# Patient Record
Sex: Female | Born: 1955 | Hispanic: No | Marital: Married | Smoking: Never smoker
Health system: Southern US, Community
[De-identification: ages and names within clinical notes are randomized; demographics above are authoritative.]

## PROBLEM LIST (undated history)

## (undated) DIAGNOSIS — M549 Dorsalgia, unspecified: Secondary | ICD-10-CM

## (undated) HISTORY — PX: TUBAL LIGATION: SHX77

---

## 1999-07-04 ENCOUNTER — Encounter: Admission: RE | Admit: 1999-07-04 | Discharge: 1999-07-04 | Payer: Self-pay | Admitting: Gynecology

## 2001-10-20 ENCOUNTER — Other Ambulatory Visit: Admission: RE | Admit: 2001-10-20 | Discharge: 2001-10-20 | Payer: Self-pay | Admitting: Gynecology

## 2003-01-17 ENCOUNTER — Encounter: Payer: Self-pay | Admitting: Gynecology

## 2003-01-17 ENCOUNTER — Encounter: Admission: RE | Admit: 2003-01-17 | Discharge: 2003-01-17 | Payer: Self-pay | Admitting: Gynecology

## 2005-04-11 ENCOUNTER — Other Ambulatory Visit: Admission: RE | Admit: 2005-04-11 | Discharge: 2005-04-11 | Payer: Self-pay | Admitting: Gynecology

## 2005-05-06 ENCOUNTER — Encounter: Admission: RE | Admit: 2005-05-06 | Discharge: 2005-05-06 | Payer: Self-pay | Admitting: Gynecology

## 2007-04-28 ENCOUNTER — Other Ambulatory Visit: Admission: RE | Admit: 2007-04-28 | Discharge: 2007-04-28 | Payer: Self-pay | Admitting: Gynecology

## 2007-05-04 ENCOUNTER — Encounter: Admission: RE | Admit: 2007-05-04 | Discharge: 2007-05-04 | Payer: Self-pay | Admitting: Gynecology

## 2007-05-19 ENCOUNTER — Encounter: Admission: RE | Admit: 2007-05-19 | Discharge: 2007-05-19 | Payer: Self-pay | Admitting: Gynecology

## 2008-11-30 ENCOUNTER — Encounter: Admission: RE | Admit: 2008-11-30 | Discharge: 2008-11-30 | Payer: Self-pay | Admitting: Obstetrics and Gynecology

## 2009-12-21 ENCOUNTER — Encounter: Admission: RE | Admit: 2009-12-21 | Discharge: 2009-12-21 | Payer: Self-pay | Admitting: Obstetrics and Gynecology

## 2010-10-13 ENCOUNTER — Encounter: Payer: Self-pay | Admitting: Gynecology

## 2011-02-06 ENCOUNTER — Other Ambulatory Visit: Payer: Self-pay | Admitting: Obstetrics and Gynecology

## 2011-02-06 DIAGNOSIS — Z1231 Encounter for screening mammogram for malignant neoplasm of breast: Secondary | ICD-10-CM

## 2011-02-10 ENCOUNTER — Ambulatory Visit
Admission: RE | Admit: 2011-02-10 | Discharge: 2011-02-10 | Disposition: A | Discharge status: Home / Self Care | Payer: 59 | Source: Ambulatory Visit | Attending: Obstetrics and Gynecology | Admitting: Obstetrics and Gynecology

## 2011-02-10 DIAGNOSIS — Z1231 Encounter for screening mammogram for malignant neoplasm of breast: Secondary | ICD-10-CM

## 2012-04-06 ENCOUNTER — Other Ambulatory Visit: Payer: Self-pay | Admitting: Obstetrics and Gynecology

## 2012-04-06 DIAGNOSIS — Z1231 Encounter for screening mammogram for malignant neoplasm of breast: Secondary | ICD-10-CM

## 2012-04-19 ENCOUNTER — Ambulatory Visit
Admission: RE | Admit: 2012-04-19 | Discharge: 2012-04-19 | Disposition: A | Discharge status: Home / Self Care | Payer: BC Managed Care – PPO | Source: Ambulatory Visit | Attending: Obstetrics and Gynecology | Admitting: Obstetrics and Gynecology

## 2012-04-19 DIAGNOSIS — Z1231 Encounter for screening mammogram for malignant neoplasm of breast: Secondary | ICD-10-CM

## 2013-08-29 ENCOUNTER — Other Ambulatory Visit: Payer: Self-pay

## 2013-08-29 DIAGNOSIS — Z1231 Encounter for screening mammogram for malignant neoplasm of breast: Secondary | ICD-10-CM

## 2013-09-27 ENCOUNTER — Ambulatory Visit
Admission: RE | Admit: 2013-09-27 | Discharge: 2013-09-27 | Disposition: A | Discharge status: Home / Self Care | Payer: BC Managed Care – PPO | Source: Ambulatory Visit

## 2013-09-27 DIAGNOSIS — Z1231 Encounter for screening mammogram for malignant neoplasm of breast: Secondary | ICD-10-CM

## 2015-01-26 ENCOUNTER — Other Ambulatory Visit: Payer: Self-pay

## 2015-01-26 DIAGNOSIS — Z1231 Encounter for screening mammogram for malignant neoplasm of breast: Secondary | ICD-10-CM

## 2015-02-26 ENCOUNTER — Ambulatory Visit
Admission: RE | Admit: 2015-02-26 | Discharge: 2015-02-26 | Disposition: A | Discharge status: Home / Self Care | Payer: BLUE CROSS/BLUE SHIELD | Source: Ambulatory Visit

## 2015-02-26 DIAGNOSIS — Z1231 Encounter for screening mammogram for malignant neoplasm of breast: Secondary | ICD-10-CM

## 2015-03-08 ENCOUNTER — Other Ambulatory Visit: Payer: Self-pay | Admitting: Obstetrics and Gynecology

## 2015-03-09 ENCOUNTER — Other Ambulatory Visit: Payer: Self-pay | Admitting: Obstetrics and Gynecology

## 2015-03-09 DIAGNOSIS — E2839 Other primary ovarian failure: Secondary | ICD-10-CM

## 2016-05-02 ENCOUNTER — Other Ambulatory Visit: Payer: Self-pay | Admitting: Obstetrics and Gynecology

## 2016-05-02 DIAGNOSIS — Z1231 Encounter for screening mammogram for malignant neoplasm of breast: Secondary | ICD-10-CM

## 2016-05-12 ENCOUNTER — Ambulatory Visit
Admission: RE | Admit: 2016-05-12 | Discharge: 2016-05-12 | Disposition: A | Discharge status: Home / Self Care | Payer: BLUE CROSS/BLUE SHIELD | Source: Ambulatory Visit | Attending: Obstetrics and Gynecology | Admitting: Obstetrics and Gynecology

## 2016-05-12 DIAGNOSIS — Z1231 Encounter for screening mammogram for malignant neoplasm of breast: Secondary | ICD-10-CM

## 2017-06-29 ENCOUNTER — Other Ambulatory Visit: Payer: Self-pay | Admitting: Orthopedic Surgery

## 2017-06-29 DIAGNOSIS — M545 Low back pain: Secondary | ICD-10-CM

## 2017-07-01 ENCOUNTER — Encounter (HOSPITAL_COMMUNITY): Payer: Self-pay | Admitting: Emergency Medicine

## 2017-07-01 ENCOUNTER — Emergency Department (HOSPITAL_COMMUNITY)
Admission: EM | Admit: 2017-07-01 | Discharge: 2017-07-01 | Disposition: A | Discharge status: Home / Self Care | Payer: BLUE CROSS/BLUE SHIELD | Attending: Emergency Medicine | Admitting: Emergency Medicine

## 2017-07-01 DIAGNOSIS — Z7982 Long term (current) use of aspirin: Secondary | ICD-10-CM | POA: Diagnosis not present

## 2017-07-01 DIAGNOSIS — Z79899 Other long term (current) drug therapy: Secondary | ICD-10-CM | POA: Insufficient documentation

## 2017-07-01 DIAGNOSIS — Z7901 Long term (current) use of anticoagulants: Secondary | ICD-10-CM | POA: Diagnosis not present

## 2017-07-01 DIAGNOSIS — I4891 Unspecified atrial fibrillation: Secondary | ICD-10-CM | POA: Insufficient documentation

## 2017-07-01 DIAGNOSIS — I499 Cardiac arrhythmia, unspecified: Secondary | ICD-10-CM | POA: Diagnosis present

## 2017-07-01 HISTORY — DX: Dorsalgia, unspecified: M54.9

## 2017-07-01 LAB — BASIC METABOLIC PANEL
Anion gap: 11 (ref 5–15)
BUN: 26 mg/dL — AB (ref 6–20)
CHLORIDE: 103 mmol/L (ref 101–111)
CO2: 26 mmol/L (ref 22–32)
Calcium: 9.7 mg/dL (ref 8.9–10.3)
Creatinine, Ser: 0.88 mg/dL (ref 0.44–1.00)
GLUCOSE: 120 mg/dL — AB (ref 65–99)
POTASSIUM: 4 mmol/L (ref 3.5–5.1)
SODIUM: 140 mmol/L (ref 135–145)

## 2017-07-01 LAB — CBC
HCT: 40.1 % (ref 36.0–46.0)
HEMOGLOBIN: 13.8 g/dL (ref 12.0–15.0)
MCH: 31.2 pg (ref 26.0–34.0)
MCHC: 34.4 g/dL (ref 30.0–36.0)
MCV: 90.5 fL (ref 78.0–100.0)
Platelets: 290 10*3/uL (ref 150–400)
RBC: 4.43 MIL/uL (ref 3.87–5.11)
RDW: 12.3 % (ref 11.5–15.5)
WBC: 11.8 10*3/uL — ABNORMAL HIGH (ref 4.0–10.5)

## 2017-07-01 MED ORDER — APIXABAN 5 MG PO TABS: 5.0000 mg | ORAL_TABLET | Freq: Two times a day (BID) | ORAL | 0 refills | Status: AC

## 2017-07-01 MED ORDER — METOPROLOL SUCCINATE ER 25 MG PO TB24: 25.0000 mg | ORAL_TABLET | Freq: Every day | ORAL | 0 refills | Status: AC

## 2017-07-01 MED ORDER — ETOMIDATE 2 MG/ML IV SOLN
INTRAVENOUS | Status: AC | PRN
  Administered 2017-07-01: 14 mg via INTRAVENOUS

## 2017-07-01 MED ORDER — METOPROLOL TARTRATE 5 MG/5ML IV SOLN
5.0000 mg | Freq: Once | INTRAVENOUS | Status: AC
  Administered 2017-07-01: 5 mg via INTRAVENOUS
  Filled 2017-07-01: qty 5

## 2017-07-01 MED ORDER — ETOMIDATE 2 MG/ML IV SOLN
0.2000 mg/kg | Freq: Once | INTRAVENOUS | Status: DC
Start: 2017-07-01 — End: 2017-07-01

## 2017-07-01 NOTE — ED Triage Notes (Signed)
Pt c/o irregular heart rate since last evening. Pt states she had recently started steroid dose pack for possible herniated disc. Pt states she went to work this morning and had EKG done (surgical center) and patient was in a-fib. No hx of a-fib. Denies cp, denies sob.

## 2017-07-01 NOTE — ED Provider Notes (Signed)
[facility]-EMERGENCY DEPT Provider Note   CSN: [id number] Arrival date & time: 07/01/17  0746     History   Chief Complaint Chief Complaint  Patient presents with  . Irregular Heart Beat    HPI Kara Lawson is a 61 y.o. female.  HPI Patient presents emergency department with new-onset palpitations began this morning.Associated chest pain or shortness breath.  No chest tightness.  She was recent started on steroids for left-sided sciatica.  Her sciatica has improved but noticed this this morning.  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  She's never been in A. fib before.  She took aspirin prior to arrival.  Can still feel as though she is in an abnormal rhythm now.  No palpitations over the past several days until early this morning   Past Medical History:  Diagnosis Date  . Back pain     There are no active problems to display for this patient.   Past Surgical History:  Procedure Laterality Date  . TUBAL LIGATION      OB History    No data available       Home Medications    Prior to Admission medications   Medication Sig Start Date End Date Taking? Authorizing Provider  aspirin EC 81 MG tablet Take 162 mg by mouth once.   Yes [provider]  HYDROcodone-acetaminophen (NORCO/VICODIN) 5-325 MG tablet Take 1 tablet by mouth every 4 (four) hours as needed for pain. 06/29/17  Yes [provider]  methocarbamol (ROBAXIN) 750 MG tablet Take 750 mg by mouth every 6 (six) hours as needed for muscle spasms. 06/29/17  Yes [provider]  predniSONE (STERAPRED UNI-PAK 21 TAB) 10 MG (21) TBPK tablet Take 10-60 mg by mouth as directed. TAKE 6 TABLETS ON DAY 1 AS DIRECTED ON PACKAGE AND DECREASE BY 1 TAB EACH DAY FOR A TOTAL OF 6 DAYS Started 10/08 06/29/17  Yes [provider]  apixaban (ELIQUIS) 5 MG TABS tablet Take 1 tablet (5 mg total) by mouth 2 (two) times daily. 07/01/17    Azalia Bilis, MD  metoprolol succinate (TOPROL-XL) 25 MG 24 hr tablet Take 1 tablet (25 mg total) by mouth daily. 07/01/17   Azalia Bilis, MD    Family History Family History  Problem Relation Age of Onset  . Atrial fibrillation Father     Social History Social History  Substance Use Topics  . Smoking status: Never Smoker  . Smokeless tobacco: Never Used  . Alcohol use No     Allergies   Patient has no known allergies.   Review of Systems Review of Systems  All other systems reviewed and are negative.    Physical Exam Updated Vital Signs BP (!) 163/82   Pulse (!) 56   Temp 98.1 F (36.7 C) (Oral)   Resp 15   Wt 68 kg (150 lb)   SpO2 98%   Physical Exam  Constitutional: She is oriented to person, place, and time. She appears well-developed and well-nourished. No distress.  HENT:  Head: Normocephalic and atraumatic.  Eyes: EOM are normal.  Neck: Normal range of motion.  Cardiovascular:  Regular rate.  Irregularly irregular rhythm  Pulmonary/Chest: Effort normal and breath sounds normal.  Abdominal: Soft. She exhibits no distension. There is no tenderness.  Musculoskeletal: Normal range of motion.  Neurological: She is alert and oriented to person, place, and time.  Skin: Skin is warm and  dry.  Psychiatric: She has a normal mood and affect. Judgment normal.  Nursing note and vitals reviewed.    ED Treatments / Results  Labs (all labs ordered are listed, but only abnormal results are displayed) Labs Reviewed  BASIC METABOLIC PANEL - Abnormal; Notable for the following:       Result Value   Glucose, Bld 120 (*)    BUN 26 (*)    All other components within normal limits  CBC - Abnormal; Notable for the following:    WBC 11.8 (*)    All other components within normal limits    EKG  EKG Interpretation #1  Date/Time:  Wednesday July 01 2017 08:02:19 EDT Ventricular Rate:  86 PR Interval:    QRS Duration: 88 QT Interval:  354 QTC  Calculation: 424 R Axis:   -31 Text Interpretation:  Atrial fibrillation Left axis deviation Low voltage, precordial leads RSR' in V1 or V2, probably normal variant No old tracing to compare Confirmed by Azalia Bilis ([id number]) on 07/01/2017 11:43:42 AM        EKG Interpretation #2  Date/Time:  Wednesday July 01 2017 10:37:01 EDT Ventricular Rate:  56 PR Interval:    QRS Duration: 92 QT Interval:  407 QTC Calculation: 393 R Axis:   -34 Text Interpretation:  Sinus rhythm Left axis deviation Low voltage, precordial leads no longer in atrial fibrillation Confirmed by Azalia Bilis ([id number]) on 07/01/2017 11:44:43 AM        Radiology No results found.  Procedures .Sedation Performed by: Azalia Bilis Authorized by: Azalia Bilis   Consent:    Consent obtained:  Verbal   Consent given by:  Patient   Risks discussed:  Allergic reaction, dysrhythmia, inadequate sedation, nausea, prolonged hypoxia resulting in organ damage, prolonged sedation necessitating reversal, respiratory compromise necessitating ventilatory assistance and intubation and vomiting   Alternatives discussed:  Analgesia without sedation Universal protocol:    Procedure explained and questions answered to patient or proxy's satisfaction: yes     Relevant documents present and verified: yes     Test results available and properly labeled: yes     Required blood products, implants, devices, and special equipment available: yes     Immediately prior to procedure a time out was called: yes     Patient identity confirmation method:  Verbally with patient Indications:    Procedure necessitating sedation performed by:  Physician performing sedation   Intended level of sedation:  Deep Pre-sedation assessment:    ASA classification: class 1 - normal, healthy patient     Neck mobility: normal     Mouth opening:  3 or more finger widths   Thyromental distance:  4 finger widths   Mallampati score:  I - soft palate,  uvula, fauces, pillars visible   Pre-sedation assessments completed and reviewed: airway patency, cardiovascular function, hydration status, mental status, nausea/vomiting, pain level, respiratory function and temperature   Immediate pre-procedure details:    Reassessment: Patient reassessed immediately prior to procedure     Reviewed: vital signs, relevant labs/tests and NPO status     Verified: bag valve mask available, emergency equipment available, intubation equipment available, IV patency confirmed, oxygen available and suction available   Procedure details (see MAR for exact dosages):    Preoxygenation:  Nasal cannula   Sedation:  Etomidate   Intra-procedure monitoring:  Blood pressure monitoring, cardiac monitor, continuous pulse oximetry, frequent LOC assessments, frequent vital sign checks and continuous capnometry   Intra-procedure events: none  Total Provider sedation time (minutes):  15 Post-procedure details:    Attendance: Constant attendance by certified staff until patient recovered     Recovery: Patient returned to pre-procedure baseline     Post-sedation assessments completed and reviewed: airway patency, cardiovascular function, hydration status, mental status, nausea/vomiting, pain level, respiratory function and temperature     Patient is stable for discharge or admission: yes     Patient tolerance:  Tolerated well, no immediate complications .Cardioversion Performed by: Azalia Bilis Authorized by: Azalia Bilis   Consent:    Consent obtained:  Verbal   Consent given by:  Patient   Risks discussed:  Cutaneous burn and death   Alternatives discussed:  No treatment Pre-procedure details:    Cardioversion basis:  Elective   Rhythm:  Atrial fibrillation   Electrode placement:  Anterior-posterior Patient sedated: Yes. Refer to sedation procedure documentation for details of sedation.  Attempt one:    Cardioversion mode:  Synchronous   Waveform:  Biphasic    Shock (Joules):  150   Shock outcome:  Conversion to normal sinus rhythm Post-procedure details:    Patient status:  Awake   Patient tolerance of procedure:  Tolerated well, no immediate complications   (including critical care time)  Medications Ordered in ED Medications  etomidate (AMIDATE) injection 13.42 mg (13.42 mg Intravenous See Procedure Record 07/01/17 1055)  metoprolol tartrate (LOPRESSOR) injection 5 mg (5 mg Intravenous Given 07/01/17 0833)  metoprolol tartrate (LOPRESSOR) injection 5 mg (5 mg Intravenous Given 07/01/17 0911)  etomidate (AMIDATE) injection (14 mg Intravenous Given 07/01/17 1035)     Initial Impression / Assessment and Plan / ED Course  I have reviewed the triage vital signs and the nursing notes.  Pertinent labs & imaging results that were available during my care of the patient were reviewed by me and considered in my medical decision making (see chart for details).     New-onset atrial fibrillation.  Patient was unable to convert with IV metoprolol.  DC cardioversion in the emergency department resulted in return to sinus rhythm.  Patient will follow-up in the A. fib clinic.  Patient be discharged home on anticoagulation for 30 days.  Standard anticoagulation warnings given.  Home on metoprolol.  Patient understands to return to the ER for new or worsening symptoms    CHA2DS2/VAS Stroke Risk Points     Score = 1   Final Clinical Impressions(s) / ED Diagnoses   Final diagnoses:  New onset atrial fibrillation (HCC)    New Prescriptions New Prescriptions   APIXABAN (ELIQUIS) 5 MG TABS TABLET    Take 1 tablet (5 mg total) by mouth 2 (two) times daily.   METOPROLOL SUCCINATE (TOPROL-XL) 25 MG 24 HR TABLET    Take 1 tablet (25 mg total) by mouth daily.     Azalia Bilis, MD 07/01/17 1149

## 2017-07-06 ENCOUNTER — Encounter (HOSPITAL_COMMUNITY): Payer: Self-pay | Admitting: Nurse Practitioner

## 2017-07-06 ENCOUNTER — Ambulatory Visit (HOSPITAL_COMMUNITY)
Admission: RE | Admit: 2017-07-06 | Discharge: 2017-07-06 | Disposition: A | Discharge status: Home / Self Care | Payer: BLUE CROSS/BLUE SHIELD | Source: Ambulatory Visit | Attending: Nurse Practitioner | Admitting: Nurse Practitioner

## 2017-07-06 VITALS — BP 106/74 | HR 70 | Ht 66.0 in | Wt 152.0 lb

## 2017-07-06 DIAGNOSIS — Z79899 Other long term (current) drug therapy: Secondary | ICD-10-CM | POA: Insufficient documentation

## 2017-07-06 DIAGNOSIS — I480 Paroxysmal atrial fibrillation: Secondary | ICD-10-CM | POA: Diagnosis not present

## 2017-07-06 DIAGNOSIS — Z7901 Long term (current) use of anticoagulants: Secondary | ICD-10-CM | POA: Insufficient documentation

## 2017-07-06 DIAGNOSIS — Z8249 Family history of ischemic heart disease and other diseases of the circulatory system: Secondary | ICD-10-CM | POA: Insufficient documentation

## 2017-07-06 DIAGNOSIS — I4891 Unspecified atrial fibrillation: Secondary | ICD-10-CM | POA: Diagnosis present

## 2017-07-06 NOTE — Progress Notes (Signed)
Primary Care Physician: Patient, No Pcp Per Referring Physician: [facility] ER f/u   Kara Lawson is a 61 y.o. female without any significant medical history that is here today for new onset of afib in the setting of prednisone use. She developed sciatica and was given prednisone and muscle relaxer. 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 Went to work and connected herself to monitor and noted afib and went to the ER. She was cardioverted. She is now off prednisone and is in SR today. She has a chadsvasc score of 1 and will not need anticoagulation after the 30 day required period after cardioversion. She would also like to wean off BB. She does not smoke, decaf products, no snoring, is not overweight and is active. No alcohol.  Today, she denies symptoms of palpitations, chest pain, shortness of breath, orthopnea, PND, lower extremity edema, dizziness, presyncope, syncope, or neurologic sequela. The patient is tolerating medications without difficulties and is otherwise without complaint today.   Past Medical History:  Diagnosis Date  . Back pain    Past Surgical History:  Procedure Laterality Date  . TUBAL LIGATION      Current Outpatient Prescriptions  Medication Sig Dispense Refill  . apixaban (ELIQUIS) 5 MG TABS tablet Take 1 tablet (5 mg total) by mouth 2 (two) times daily. 60 tablet 0  . metoprolol succinate (TOPROL-XL) 25 MG 24 hr tablet Take 1 tablet (25 mg total) by mouth daily. (Patient taking differently: Take 12.5 mg by mouth daily. ) 30 tablet 0   No current facility-administered medications for this encounter.     No Known Allergies  Social History   Social History  . Marital status: Married    Spouse name: N/A  . Number of children: N/A  . Years of education: N/A   Occupational History  . Not on file.   Social History Main Topics  . Smoking status: Never Smoker  . Smokeless tobacco: Never  Used  . Alcohol use No  . Drug use: No  . Sexual activity: Not on file   Other Topics Concern  . Not on file   Social History Narrative  . No narrative on file    Family History  Problem Relation Age of Onset  . Atrial fibrillation Father     ROS- All systems are reviewed and negative except as per the HPI above  Physical Exam: Vitals:   07/06/17 1519  BP: 106/74  Pulse: 70  Weight: 152 lb (68.9 kg)  Height:  (1.676 m)   Wt Readings from Last 3 Encounters:  07/06/17 152 lb (68.9 kg)  07/01/17 150 lb (68 kg)    Labs: Lab Results  Component Value Date   NA 140 07/01/2017   K 4.0 07/01/2017   CL 103 07/01/2017   CO2 26 07/01/2017   GLUCOSE 120 (H) 07/01/2017   BUN 26 (H) 07/01/2017   CREATININE 0.88 07/01/2017   CALCIUM 9.7 07/01/2017   No results found for: INR No results found for: CHOL, HDL, LDLCALC, TRIG   GEN- The patient is well appearing, alert and oriented x 3 today.   Head- normocephalic, atraumatic Eyes-  Sclera clear, conjunctiva pink Ears- hearing intact Oropharynx- clear Neck- supple, no JVP Lymph- no cervical lymphadenopathy Lungs- Clear to ausculation bilaterally, normal work of breathing Heart- Regular rate and rhythm, no murmurs, rubs or gallops, PMI not laterally displaced GI- soft, NT, ND, + BS  Extremities- no clubbing, cyanosis, or edema MS- no significant deformity or atrophy Skin- no rash or lesion Psych- euthymic mood, full affect Neuro- strength and sensation are intact  EKG- NSR at 70 bpm, pr int 140 ms, qrs int 76 ms, qtc Epic records reviewed    Assessment and Plan: 1. New onset afib in the setting of prednisone taper Now in SR with successful cardioversion Chadsvasc score of 1, for female, will not need anticoagulation after the 30 day requirement  s/p cardioversion, will finish Eliquis and then stop Bleeding precautions reviewed She would like to cut BB in half x one week and then stop Echo  Will call  report of echo, if normal, no f/u for now, but if return of afib will need to establish with general cardiology  [name] [address] [place], [place] [address] [phone]

## 2017-07-07 ENCOUNTER — Ambulatory Visit (HOSPITAL_COMMUNITY)
Admission: RE | Admit: 2017-07-07 | Discharge: 2017-07-07 | Disposition: A | Discharge status: Home / Self Care | Payer: BLUE CROSS/BLUE SHIELD | Source: Ambulatory Visit | Attending: Nurse Practitioner | Admitting: Nurse Practitioner

## 2017-07-07 DIAGNOSIS — I480 Paroxysmal atrial fibrillation: Secondary | ICD-10-CM | POA: Diagnosis not present

## 2017-07-07 DIAGNOSIS — I4891 Unspecified atrial fibrillation: Secondary | ICD-10-CM | POA: Diagnosis present

## 2017-07-07 DIAGNOSIS — I080 Rheumatic disorders of both mitral and aortic valves: Secondary | ICD-10-CM | POA: Insufficient documentation

## 2017-07-07 NOTE — Progress Notes (Signed)
  Echocardiogram 2D Echocardiogram has been performed.  Kara Lawson 07/07/2017, 8:36 AM

## 2017-07-14 ENCOUNTER — Other Ambulatory Visit: Payer: BLUE CROSS/BLUE SHIELD

## 2017-07-14 ENCOUNTER — Ambulatory Visit
Admission: RE | Admit: 2017-07-14 | Discharge: 2017-07-14 | Disposition: A | Discharge status: Home / Self Care | Payer: BLUE CROSS/BLUE SHIELD | Source: Ambulatory Visit | Attending: Orthopedic Surgery | Admitting: Orthopedic Surgery

## 2017-07-14 DIAGNOSIS — M545 Low back pain: Secondary | ICD-10-CM

## 2017-10-08 ENCOUNTER — Other Ambulatory Visit: Payer: Self-pay | Admitting: Obstetrics and Gynecology

## 2017-10-08 DIAGNOSIS — Z139 Encounter for screening, unspecified: Secondary | ICD-10-CM

## 2017-11-09 ENCOUNTER — Ambulatory Visit
Admission: RE | Admit: 2017-11-09 | Discharge: 2017-11-09 | Disposition: A | Discharge status: Home / Self Care | Payer: BLUE CROSS/BLUE SHIELD | Source: Ambulatory Visit | Attending: Obstetrics and Gynecology | Admitting: Obstetrics and Gynecology

## 2017-11-09 DIAGNOSIS — Z139 Encounter for screening, unspecified: Secondary | ICD-10-CM

## 2018-09-15 IMAGING — MR MR LUMBAR SPINE W/O CM
4 of 5 series · 25 of 48 positions shown · non-contrast
Comparison: None.

CLINICAL DATA: Low back pain. Burning and cramping in the posterior
left leg. Symptoms for 3 weeks.

EXAM:
MRI LUMBAR SPINE WITHOUT CONTRAST
TECHNIQUE: Multiplanar, multisequence MR imaging of the lumbar spine was
performed. No intravenous contrast was administered.

[Series 4: T2 post-contrast · sagittal · 4.0mm · 0.55mm/px · 5 of 13 slices shown]
[im 1/13]
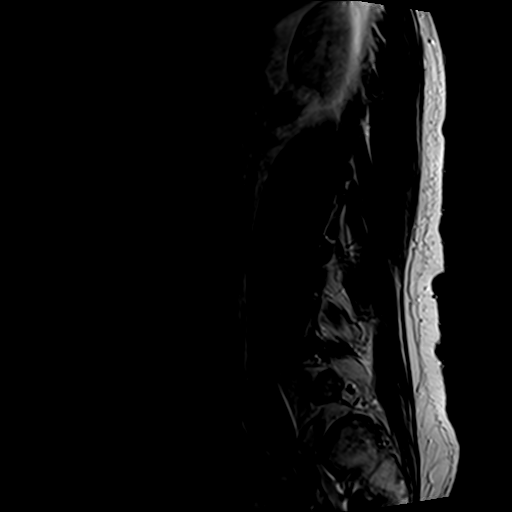
[im 4/13]
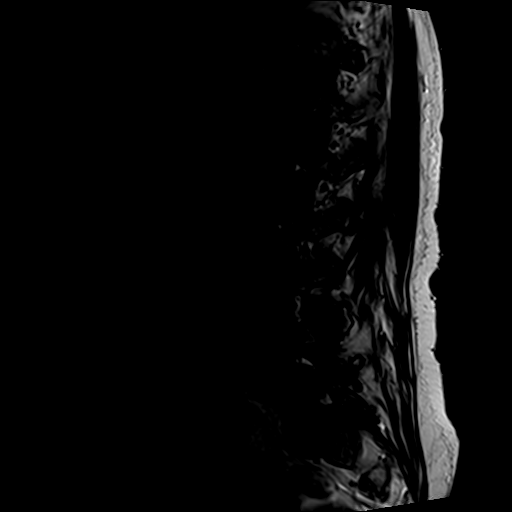
[im 7/13]
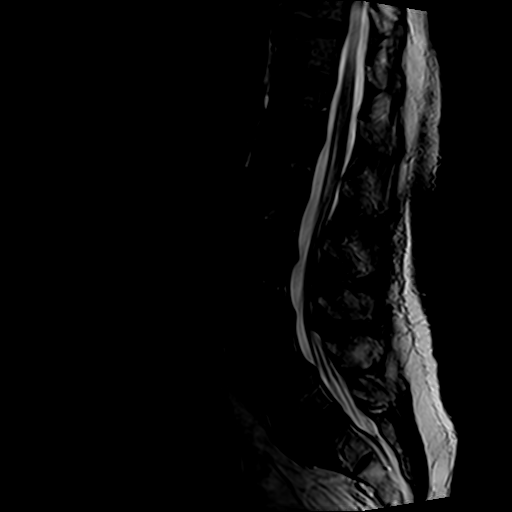
[im 10/13]
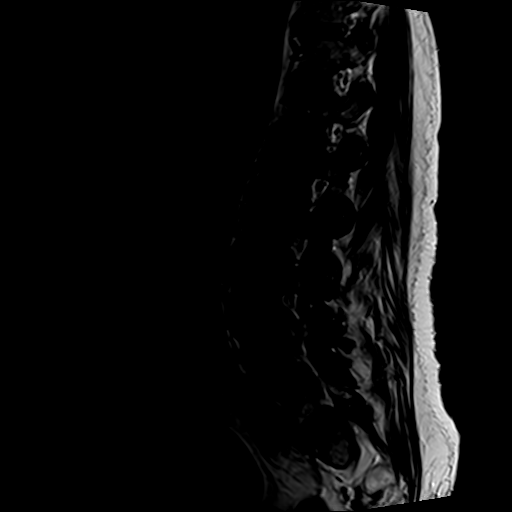
[im 13/13]
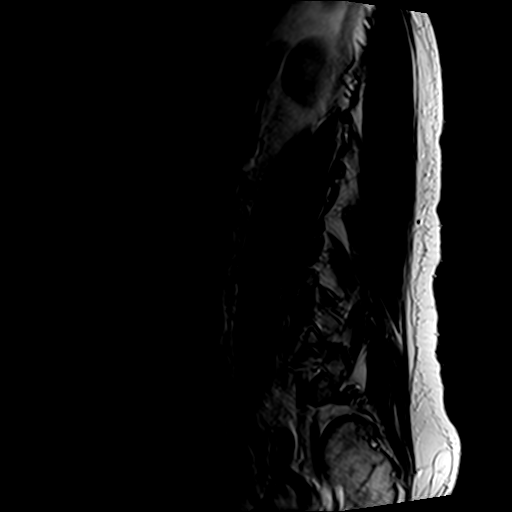

[Series 5: T1 · sagittal · 4.0mm · 0.55mm/px · 5 of 13 slices shown (1 of 2)]
[im 1/13]
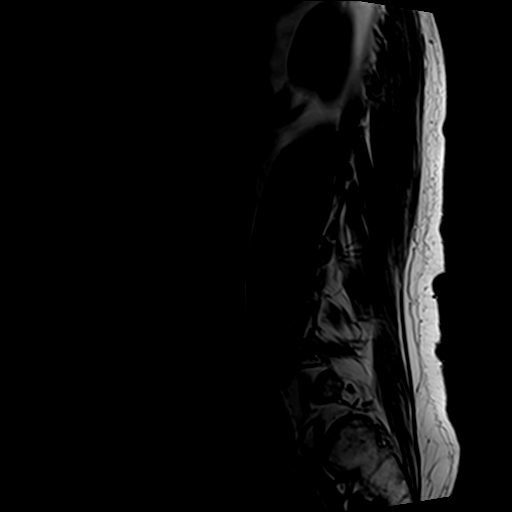
[im 4/13]
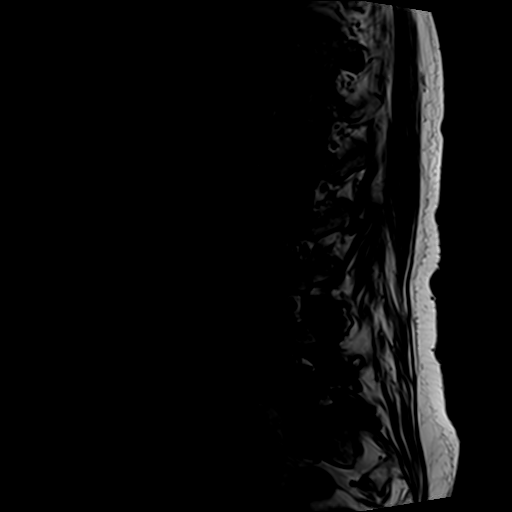
[im 7/13]
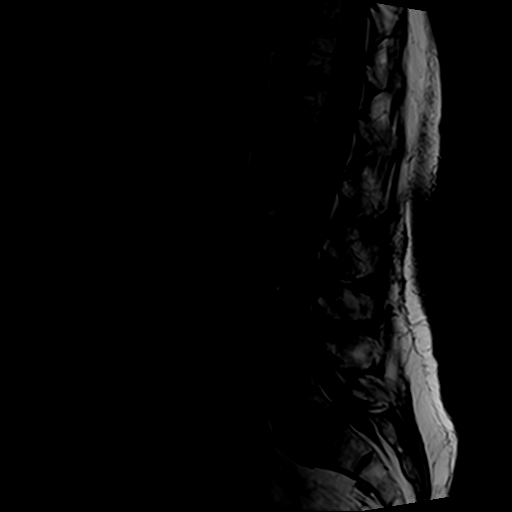
[im 10/13]
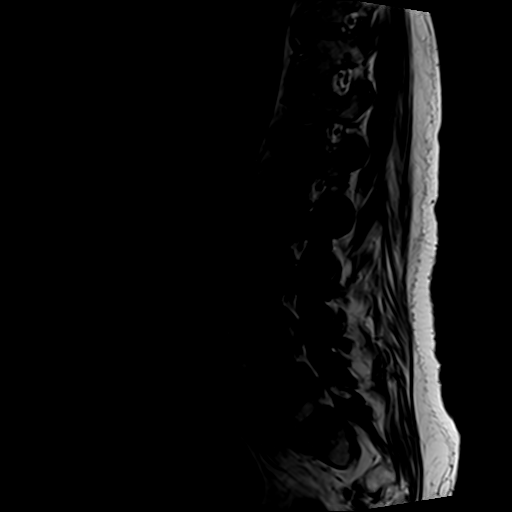
[im 13/13]
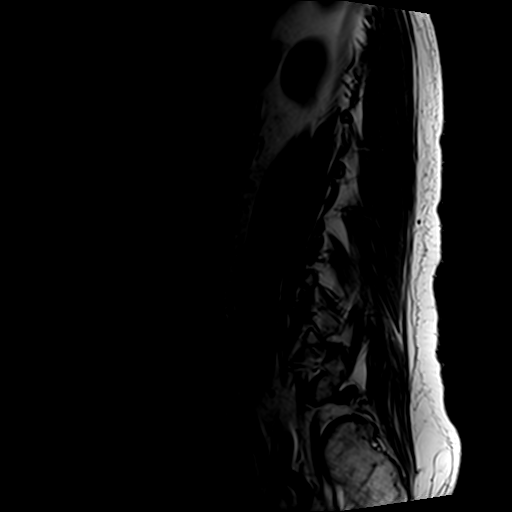

[Series 6: T2 · axial · 4.0mm · 0.70mm/px · z∈[-30,+186]mm · 10 of 40 slices shown]
[im 3/40]
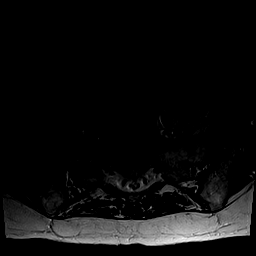
[im 6/40]
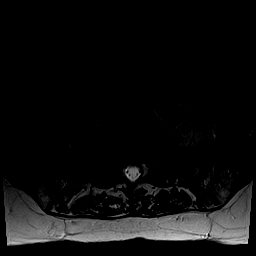
[im 8/40]
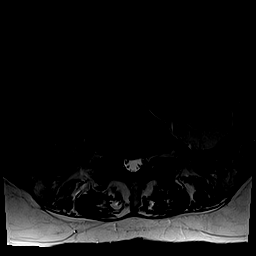
[im 14/40]
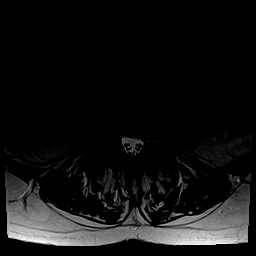
[im 19/40]
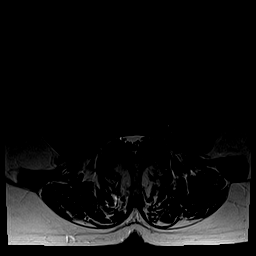
[im 21/40]
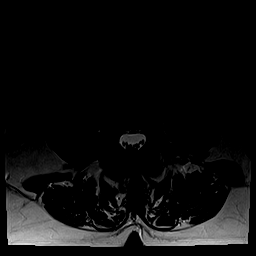
[im 24/40]
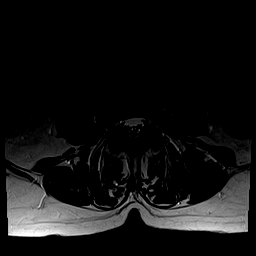
[im 29/40]
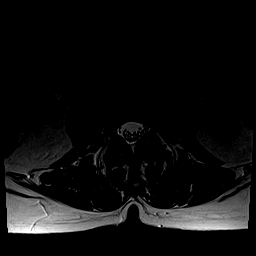
[im 34/40]
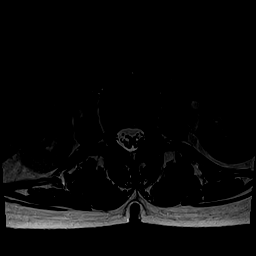
[im 40/40]
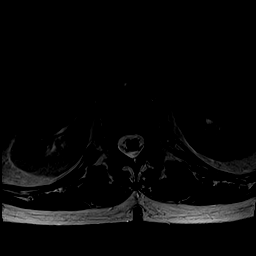

[Series 7: T1 · axial · 4.0mm · 0.35mm/px · z∈[-30,+155]mm · 5 of 40 slices shown (2 of 2)]
[im 3/40]
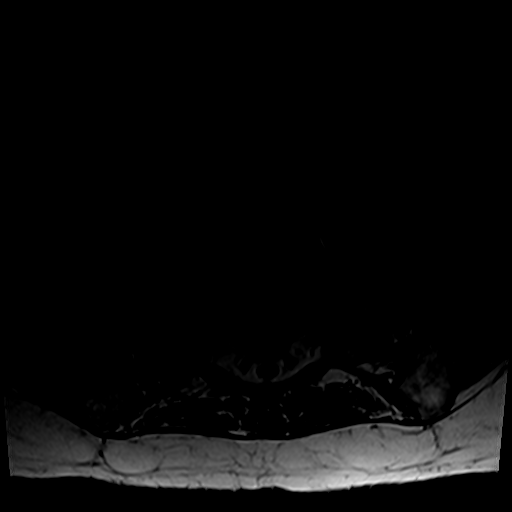
[im 6/40]
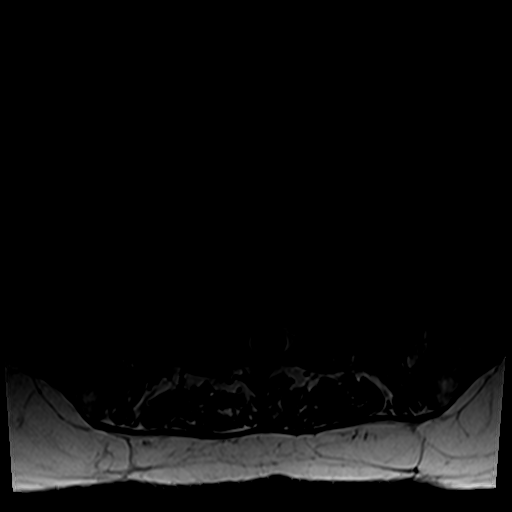
[im 8/40]
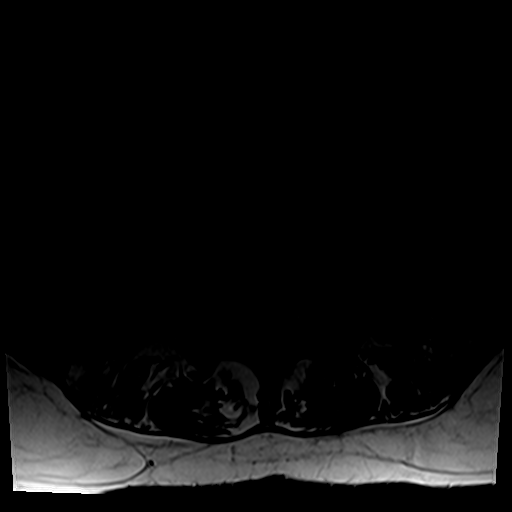
[im 21/40]
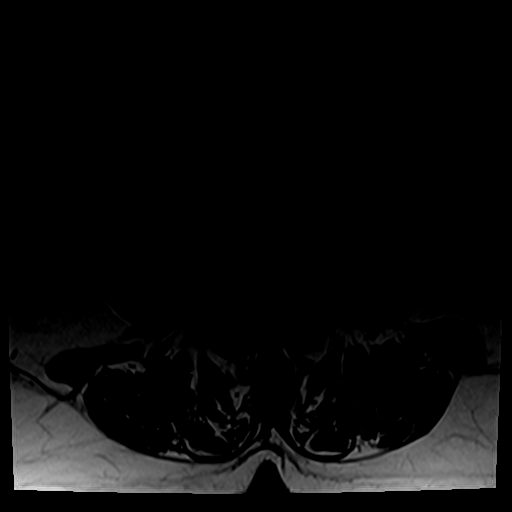
[im 34/40]
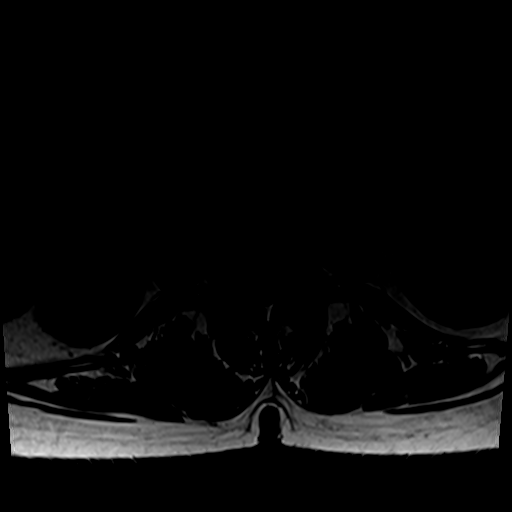

[25 of 48 positions shown; findings below may reference images not displayed]

FINDINGS: Segmentation: Normal lumbar segmentation is assumed, with the lowest
fully formed disc space designated L5-S1.

Alignment: 5 mm facet mediated anterolisthesis of L4 on L5. Trace
anterolisthesis of L3 on L4. Trace retrolisthesis of L2 on L3.

Vertebrae: No fracture, suspicious osseous lesion, or significant
marrow edema. Small Schmorl's nodes at T12.

Conus medullaris: Extends to the L1 level and appears normal.

Paraspinal and other soft tissues: Unremarkable.

Disc levels:

Disc desiccation throughout the lumbar spine. Mild-to-moderate disc
space narrowing at L4-5.

T12-L1: Shallow left central disc protrusion and minimal disc
bulging without stenosis.

L1-2:  Mild facet hypertrophy without disc herniation or stenosis.

L2-3: Disc bulging and mild facet and ligamentum flavum hypertrophy
without stenosis.

L3-4: Mild disc bulging and moderate to severe facet arthrosis
without significant stenosis.

L4-5: Anterolisthesis with disc uncovering and severe facet
arthrosis result in moderate bilateral lateral recess stenosis
potentially affecting the L5 nerve roots. No significant generalized
spinal stenosis. Borderline right neural foraminal stenosis.

L5-S1: Severe facet arthrosis and minimal disc bulging without
significant stenosis.
IMPRESSION: 1. Severe L4-5 facet arthrosis with grade 1 anterolisthesis and
moderate bilateral lateral recess stenosis.
2. Severe L5-S1 facet arthrosis without listhesis or stenosis.
3. Minimal anterolisthesis of L3 on L4 without stenosis.

## 2019-03-14 ENCOUNTER — Other Ambulatory Visit: Payer: Self-pay | Admitting: Obstetrics and Gynecology

## 2019-03-14 DIAGNOSIS — Z1231 Encounter for screening mammogram for malignant neoplasm of breast: Secondary | ICD-10-CM

## 2019-03-15 ENCOUNTER — Ambulatory Visit
Admission: RE | Admit: 2019-03-15 | Discharge: 2019-03-15 | Disposition: A | Discharge status: Home / Self Care | Payer: BLUE CROSS/BLUE SHIELD | Source: Ambulatory Visit | Attending: Obstetrics and Gynecology | Admitting: Obstetrics and Gynecology

## 2019-03-15 ENCOUNTER — Other Ambulatory Visit: Payer: Self-pay

## 2019-03-15 DIAGNOSIS — Z1231 Encounter for screening mammogram for malignant neoplasm of breast: Secondary | ICD-10-CM

## 2019-11-14 ENCOUNTER — Ambulatory Visit: Payer: BC Managed Care – PPO | Attending: Family

## 2019-11-14 DIAGNOSIS — Z23 Encounter for immunization: Secondary | ICD-10-CM | POA: Insufficient documentation

## 2019-11-14 NOTE — Progress Notes (Signed)
   Covid-19 Vaccination Clinic  Name:  Kara Lawson    MRN: [id number] DOB: 1956/02/10  11/14/2019  Kara Lawson was observed post Covid-19 immunization for 15 minutes without incidence. She was provided with Vaccine Information Sheet and instruction to access the V-Safe system.   Kara Lawson was instructed to call 911 with any severe reactions post vaccine: Marland Kitchen Difficulty breathing  . Swelling of your face and throat  . A fast heartbeat  . A bad rash all over your body  . Dizziness and weakness    Immunizations Administered    Name Date Dose VIS Date Route   Moderna COVID-19 Vaccine 11/14/2019  3:09 PM 0.5 mL 08/23/2019 Intramuscular   Manufacturer: Moderna   Lot: [id number]   NDC: 50388-828-00

## 2019-12-13 ENCOUNTER — Ambulatory Visit: Payer: BC Managed Care – PPO | Attending: Family

## 2019-12-13 DIAGNOSIS — Z23 Encounter for immunization: Secondary | ICD-10-CM

## 2019-12-13 NOTE — Progress Notes (Signed)
   Covid-19 Vaccination Clinic  Name:  Kara Lawson    MRN: [id number] DOB: [birth year]  12/13/2019  Kara Lawson was observed post Covid-19 immunization for 15 minutes without incident. She was provided with Vaccine Information Sheet and instruction to access the V-Safe system.   Kara Lawson was instructed to call 911 with any severe reactions post vaccine: Marland Kitchen Difficulty breathing  . Swelling of face and throat  . A fast heartbeat  . A bad rash all over body  . Dizziness and weakness   Immunizations Administered    Name Date Dose VIS Date Route   Moderna COVID-19 Vaccine 12/13/2019  4:28 PM 0.5 mL 08/23/2019 Intramuscular   Manufacturer: Moderna   Lot: [id number]   NDC: 15400-867-61

## 2019-12-27 ENCOUNTER — Ambulatory Visit: Payer: BC Managed Care – PPO

## 2023-01-08 ENCOUNTER — Other Ambulatory Visit: Payer: Self-pay | Admitting: Obstetrics and Gynecology

## 2023-01-08 DIAGNOSIS — Z1231 Encounter for screening mammogram for malignant neoplasm of breast: Secondary | ICD-10-CM

## 2023-01-13 ENCOUNTER — Ambulatory Visit
Admission: RE | Admit: 2023-01-13 | Discharge: 2023-01-13 | Disposition: A | Discharge status: Home / Self Care | Payer: Medicare Other | Source: Ambulatory Visit | Attending: Obstetrics and Gynecology | Admitting: Obstetrics and Gynecology

## 2023-01-13 DIAGNOSIS — Z1231 Encounter for screening mammogram for malignant neoplasm of breast: Secondary | ICD-10-CM

## 2024-02-22 ENCOUNTER — Other Ambulatory Visit: Payer: Self-pay | Admitting: Obstetrics and Gynecology

## 2024-02-22 DIAGNOSIS — Z1231 Encounter for screening mammogram for malignant neoplasm of breast: Secondary | ICD-10-CM

## 2024-03-07 ENCOUNTER — Ambulatory Visit

## 2024-03-21 ENCOUNTER — Ambulatory Visit
Admission: RE | Admit: 2024-03-21 | Discharge: 2024-03-21 | Disposition: A | Discharge status: Home / Self Care | Source: Ambulatory Visit | Attending: Obstetrics and Gynecology | Admitting: Obstetrics and Gynecology

## 2024-03-21 DIAGNOSIS — Z1231 Encounter for screening mammogram for malignant neoplasm of breast: Secondary | ICD-10-CM
